# Patient Record
Sex: Male | Born: 1990 | Hispanic: Yes | Marital: Married | Smoking: Never smoker
Health system: Southern US, Community
[De-identification: ages and names within clinical notes are randomized; demographics above are authoritative.]

---

## 2015-04-09 ENCOUNTER — Encounter (HOSPITAL_COMMUNITY): Payer: Self-pay | Admitting: Emergency Medicine

## 2015-04-09 ENCOUNTER — Emergency Department (HOSPITAL_COMMUNITY)
Admission: EM | Admit: 2015-04-09 | Discharge: 2015-04-09 | Disposition: A | Discharge status: Home / Self Care | Payer: BLUE CROSS/BLUE SHIELD | Source: Home / Self Care | Attending: Emergency Medicine | Admitting: Emergency Medicine

## 2015-04-09 DIAGNOSIS — J40 Bronchitis, not specified as acute or chronic: Secondary | ICD-10-CM | POA: Diagnosis not present

## 2015-04-09 MED ORDER — PREDNISONE 50 MG PO TABS: ORAL_TABLET | ORAL | Status: DC

## 2015-04-09 MED ORDER — AZITHROMYCIN 250 MG PO TABS: ORAL_TABLET | ORAL | Status: DC

## 2015-04-09 MED ORDER — HYDROCODONE-HOMATROPINE 5-1.5 MG/5ML PO SYRP: 5.0000 mL | ORAL_SOLUTION | Freq: Four times a day (QID) | ORAL | Status: DC | PRN

## 2015-04-09 NOTE — ED Provider Notes (Signed)
CSN: [id number]     Arrival date & time 04/09/15  1829 History   First MD Initiated Contact with Patient 04/09/15 1940     Chief Complaint  Patient presents with  . Cough  . Headache  . Sore Throat   (Consider location/radiation/quality/duration/timing/severity/associated sxs/prior Treatment) HPI He is a 25 year old man here for evaluation of cough. He states his symptoms started 2 days ago with cough and nasal congestion. Since then he has developed a sore throat and worsening cough. He reports subjective fevers today. He does report some nausea with coughing spells. No vomiting. No shortness of breath.  He has been taking Mucinex and an over-the-counter cold and flu medicine without improvement.  History reviewed. No pertinent past medical history. History reviewed. No pertinent past surgical history. History reviewed. No pertinent family history. Social History  Substance Use Topics  . Smoking status: Never Smoker   . Smokeless tobacco: None  . Alcohol Use: No    Review of Systems As in history of present illness Allergies  Review of patient's allergies indicates no known allergies.  Home Medications   Prior to Admission medications   Medication Sig Start Date End Date Taking? Authorizing Provider  azithromycin (ZITHROMAX Z-PAK) 250 MG tablet Take 2 pills today, then 1 pill daily until gone. 04/09/15   Charm RingsErin J Melvia Matousek, MD  HYDROcodone-homatropine (HYCODAN) 5-1.5 MG/5ML syrup Take 5 mLs by mouth every 6 (six) hours as needed for cough. 04/09/15   Charm RingsErin J Sheyli Horwitz, MD  predniSONE (DELTASONE) 50 MG tablet Take 1 pill daily for 5 days. 04/09/15   Charm RingsErin J Jenine Krisher, MD   Meds Ordered and Administered this Visit  Medications - No data to display  BP 139/86 mmHg  Pulse 96  Temp(Src) 100 F (37.8 C) (Oral)  Resp 16  SpO2 100% No data found.   Physical Exam  Constitutional: He is oriented to person, place, and time. He appears well-developed and well-nourished. No distress.  HENT:   Mouth/Throat: Oropharynx is clear and moist. No oropharyngeal exudate.  Nasal discharge present.  Neck: Neck supple.  Cardiovascular: Normal rate, regular rhythm and normal heart sounds.   No murmur heard. Pulmonary/Chest: Effort normal. No respiratory distress. He has no wheezes. He has no rales.  Diffuse coarseness, particularly with forced expiration  Lymphadenopathy:    He has no cervical adenopathy.  Neurological: He is alert and oriented to person, place, and time.    ED Course  Procedures (including critical care time)  Labs Review Labs Reviewed - No data to display  Imaging Review No results found.    MDM   1. Bronchitis    Treat with azithromycin and prednisone. Hycodan as needed for cough. Recommended OTC allergy medication such as Zyrtec or Allegra. Follow-up as needed.    Charm RingsErin J Deshaun Weisinger, MD 04/09/15 [phone]

## 2015-04-09 NOTE — ED Notes (Signed)
[facility] a [facility] of a headache, cough, sore throat and runny nose x 2 days. The patient stated that he has tried OTC meds with no relief.

## 2015-04-09 NOTE — Discharge Instructions (Signed)
You have bronchitis. Take azithromycin and prednisone as prescribed. Use hycodan as needed for cough. Get an over-the-counter allergy medicine such as Zyrtec or Claritin to help with the congestion. You should see improvement in the next 2-3 days. If you develop fevers, difficulty breathing, or are just not getting better, please come back or go to the emergency room.

## 2016-07-11 DIAGNOSIS — H5213 Myopia, bilateral: Secondary | ICD-10-CM | POA: Diagnosis not present

## 2017-07-25 DIAGNOSIS — H5213 Myopia, bilateral: Secondary | ICD-10-CM | POA: Diagnosis not present

## 2017-09-11 ENCOUNTER — Ambulatory Visit: Payer: BLUE CROSS/BLUE SHIELD | Admitting: Urgent Care

## 2017-09-11 ENCOUNTER — Encounter: Payer: Self-pay | Admitting: Urgent Care

## 2017-09-11 VITALS — BP 125/83 | HR 65 | Temp 98.1°F | Resp 16 | Ht 67.0 in | Wt 188.8 lb

## 2017-09-11 DIAGNOSIS — R519 Headache, unspecified: Secondary | ICD-10-CM

## 2017-09-11 DIAGNOSIS — R42 Dizziness and giddiness: Secondary | ICD-10-CM | POA: Diagnosis not present

## 2017-09-11 DIAGNOSIS — H9202 Otalgia, left ear: Secondary | ICD-10-CM

## 2017-09-11 DIAGNOSIS — R109 Unspecified abdominal pain: Secondary | ICD-10-CM

## 2017-09-11 DIAGNOSIS — R51 Headache: Secondary | ICD-10-CM

## 2017-09-11 DIAGNOSIS — J0110 Acute frontal sinusitis, unspecified: Secondary | ICD-10-CM | POA: Diagnosis not present

## 2017-09-11 DIAGNOSIS — R0981 Nasal congestion: Secondary | ICD-10-CM

## 2017-09-11 DIAGNOSIS — R110 Nausea: Secondary | ICD-10-CM | POA: Diagnosis not present

## 2017-09-11 LAB — POCT URINALYSIS DIP (MANUAL ENTRY)
BILIRUBIN UA: NEGATIVE
Glucose, UA: NEGATIVE mg/dL
Ketones, POC UA: NEGATIVE mg/dL
LEUKOCYTES UA: NEGATIVE
NITRITE UA: NEGATIVE
PH UA: 8 (ref 5.0–8.0)
Spec Grav, UA: 1.02 (ref 1.010–1.025)
Urobilinogen, UA: 2 E.U./dL — AB

## 2017-09-11 LAB — CBC
HEMOGLOBIN: 15.4 g/dL (ref 13.0–17.7)
Hematocrit: 44.1 % (ref 37.5–51.0)
MCH: 32.7 pg (ref 26.6–33.0)
MCHC: 34.9 g/dL (ref 31.5–35.7)
MCV: 94 fL (ref 79–97)
PLATELETS: 144 10*3/uL — AB (ref 150–450)
RBC: 4.71 x10E6/uL (ref 4.14–5.80)
RDW: 12.9 % (ref 12.3–15.4)
WBC: 4.1 10*3/uL (ref 3.4–10.8)

## 2017-09-11 LAB — COMPREHENSIVE METABOLIC PANEL
ALK PHOS: 95 IU/L (ref 39–117)
ALT: 124 IU/L — ABNORMAL HIGH (ref 0–44)
AST: 82 IU/L — AB (ref 0–40)
Albumin/Globulin Ratio: 1.6 (ref 1.2–2.2)
Albumin: 4.6 g/dL (ref 3.5–5.5)
BUN/Creatinine Ratio: 19 (ref 9–20)
BUN: 17 mg/dL (ref 6–20)
Bilirubin Total: 0.4 mg/dL (ref 0.0–1.2)
CO2: 22 mmol/L (ref 20–29)
CREATININE: 0.88 mg/dL (ref 0.76–1.27)
Calcium: 9.2 mg/dL (ref 8.7–10.2)
Chloride: 104 mmol/L (ref 96–106)
GFR calc Af Amer: 138 mL/min/{1.73_m2} (ref >59)
GFR calc non Af Amer: 119 mL/min/{1.73_m2} (ref >59)
GLOBULIN, TOTAL: 2.8 g/dL (ref 1.5–4.5)
GLUCOSE: 104 mg/dL — AB (ref 65–99)
Potassium: 4.4 mmol/L (ref 3.5–5.2)
SODIUM: 140 mmol/L (ref 134–144)
Total Protein: 7.4 g/dL (ref 6.0–8.5)

## 2017-09-11 MED ORDER — AMOXICILLIN 875 MG PO TABS: 875.0000 mg | ORAL_TABLET | Freq: Two times a day (BID) | ORAL | 0 refills | Status: DC

## 2017-09-11 MED ORDER — ONDANSETRON 8 MG PO TBDP: 8.0000 mg | ORAL_TABLET | Freq: Three times a day (TID) | ORAL | 0 refills | Status: DC | PRN

## 2017-09-11 NOTE — Patient Instructions (Addendum)
You may take 500mg  Tylenol with ibuprofen 600mg  every 6 hours for pain and inflammation associated with your headache. We will try to use amoxicillin to address a sinusitis. However, if you continue to have severe headaches, develop confusion, fever, or have worsening dizziness, nausea, belly pain then return to our clinic for a recheck. Hydrate well with at least 2 liters of water daily.      Sinusitis, Adult Sinusitis is soreness and inflammation of your sinuses. Sinuses are hollow spaces in the bones around your face. Your sinuses are located:  Around your eyes.  In the middle of your forehead.  Behind your nose.  In your cheekbones.  Your sinuses and nasal passages are lined with a stringy fluid (mucus). Mucus normally drains out of your sinuses. When your nasal tissues become inflamed or swollen, the mucus can become trapped or blocked so air cannot flow through your sinuses. This allows bacteria, viruses, and funguses to grow, which leads to infection. Sinusitis can develop quickly and last for 7?10 days (acute) or for more than 12 weeks (chronic). Sinusitis often develops after a cold. What are the causes? This condition is caused by anything that creates swelling in the sinuses or stops mucus from draining, including:  Allergies.  Asthma.  Bacterial or viral infection.  Abnormally shaped bones between the nasal passages.  Nasal growths that contain mucus (nasal polyps).  Narrow sinus openings.  Pollutants, such as chemicals or irritants in the air.  A foreign object stuck in the nose.  A fungal infection. This is rare.  What increases the risk? The following factors may make you more likely to develop this condition:  Having allergies or asthma.  Having had a recent cold or respiratory tract infection.  Having structural deformities or blockages in your nose or sinuses.  Having a weak immune system.  Doing a lot of swimming or diving.  Overusing nasal  sprays.  Smoking.  What are the signs or symptoms? The main symptoms of this condition are pain and a feeling of pressure around the affected sinuses. Other symptoms include:  Upper toothache.  Earache.  Headache.  Bad breath.  Decreased sense of smell and taste.  A cough that may get worse at night.  Fatigue.  Fever.  Thick drainage from your nose. The drainage is often green and it may contain pus (purulent).  Stuffy nose or congestion.  Postnasal drip. This is when extra mucus collects in the throat or back of the nose.  Swelling and warmth over the affected sinuses.  Sore throat.  Sensitivity to light.  How is this diagnosed? This condition is diagnosed based on symptoms, a medical history, and a physical exam. To find out if your condition is acute or chronic, your health care provider may:  Look in your nose for signs of nasal polyps.  Tap over the affected sinus to check for signs of infection.  View the inside of your sinuses using an imaging device that has a light attached (endoscope).  If your health care provider suspects that you have chronic sinusitis, you may also:  Be tested for allergies.  Have a sample of mucus taken from your nose (nasal culture) and checked for bacteria.  Have a mucus sample examined to see if your sinusitis is related to an allergy.  If your sinusitis does not respond to treatment and it lasts longer than 8 weeks, you may have an MRI or CT scan to check your sinuses. These scans also help to determine  how severe your infection is. In rare cases, a bone biopsy may be done to rule out more serious types of fungal sinus disease. How is this treated? Treatment for sinusitis depends on the cause and whether your condition is chronic or acute. If a virus is causing your sinusitis, your symptoms will go away on their own within 10 days. You may be given medicines to relieve your symptoms, including:  Topical nasal decongestants.  They shrink swollen nasal passages and let mucus drain from your sinuses.  Antihistamines. These drugs block inflammation that is triggered by allergies. This can help to ease swelling in your nose and sinuses.  Topical nasal corticosteroids. These are nasal sprays that ease inflammation and swelling in your nose and sinuses.  Nasal saline washes. These rinses can help to get rid of thick mucus in your nose.  If your condition is caused by bacteria, you will be given an antibiotic medicine. If your condition is caused by a fungus, you will be given an antifungal medicine. Surgery may be needed to correct underlying conditions, such as narrow nasal passages. Surgery may also be needed to remove polyps. Follow these instructions at home: Medicines  Take, use, or apply over-the-counter and prescription medicines only as told by your health care provider. These may include nasal sprays.  If you were prescribed an antibiotic medicine, take it as told by your health care provider. Do not stop taking the antibiotic even if you start to feel better. Hydrate and Humidify  Drink enough water to keep your urine clear or pale yellow. Staying hydrated will help to thin your mucus.  Use a cool mist humidifier to keep the humidity level in your home above 50%.  Inhale steam for 10-15 minutes, 3-4 times a day or as told by your health care provider. You can do this in the bathroom while a hot shower is running.  Limit your exposure to cool or dry air. Rest  Rest as much as possible.  Sleep with your head raised (elevated).  Make sure to get enough sleep each night. General instructions  Apply a warm, moist washcloth to your face 3-4 times a day or as told by your health care provider. This will help with discomfort.  Wash your hands often with soap and water to reduce your exposure to viruses and other germs. If soap and water are not available, use hand sanitizer.  Do not smoke. Avoid being  around people who are smoking (secondhand smoke).  Keep all follow-up visits as told by your health care provider. This is important. Contact a health care provider if:  You have a fever.  Your symptoms get worse.  Your symptoms do not improve within 10 days. Get help right away if:  You have a severe headache.  You have persistent vomiting.  You have pain or swelling around your face or eyes.  You have vision problems.  You develop confusion.  Your neck is stiff.  You have trouble breathing. This information is not intended to replace advice given to you by your health care provider. Make sure you discuss any questions you have with your health care provider. Document Released: 01/17/2005 Document Revised: 09/13/2015 Document Reviewed: 11/12/2014 Elsevier Interactive Patient Education  2018 [employer]     IF you received an x-ray today, you will receive an invoice from [facility]. Please contact [facility] at [phone] with questions or concerns regarding your invoice.   IF you received labwork today, you will receive an  invoice from [facility]. Please contact [facility] at [phone] with questions or concerns regarding your invoice.   Our billing staff will not be able to assist you with questions regarding bills from these companies.  You will be contacted with the lab results as soon as they are available. The fastest way to get your results is to activate your My Chart account. Instructions are located on the last page of this paperwork. If you have not heard from [place] regarding the results in 2 weeks, please contact this office.

## 2017-09-11 NOTE — Progress Notes (Signed)
MRN: [id number] DOB: November 06, 1990  Subjective:   Arthur Morris is a 27 y.o. male presenting for 3 day history of intermittent headache. Symptoms started with mild headache Saturday. Pain was throbbing in nature, pulsating sensation behind both eyes. Headache returned Sunday and was severe, had difficulty moving his neck, eye movements were also painful. Has used Excedrin, Goody Powders, APAP. Has started having nausea without vomiting, dizziness, also had epigastric discomfort yesterday. Had mild ear pain over left side yesterday. Denies fever, confusion, sinus pain, sore throat, cough, chest pain. Denies smoking cigarettes, has occasional alcohol drink. Had 1 beer Friday night. Has ~6 bottles of water daily. Had 1 bottle of water this morning. 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 Has had ~5-6 hours of sleep per night in the past 2 weeks, usually gets ~7-8 hours. Denies history of migraines.   Arthur Morris is not currently taking any medications and has no known food or drug allergies.  Denies past medical and surgical history. Family history is positive for diabetes in his father.   Patient had Hepatitis B, polio, Hib, MMR, tdap, dtap series but not meningococcal per [facility].   Objective:   Vitals: BP 125/83   Pulse 65   Temp 98.1 F (36.7 C) (Oral)   Resp 16   Ht _0  (1.702 m)   Wt 188 lb 12.8 oz (85.6 kg)   SpO2 100%   BMI 29.57 kg/m   Physical Exam  Constitutional: He is oriented to person, place, and time. He appears well-developed and well-nourished.  HENT:  Mouth/Throat: Oropharynx is clear and moist.  Frontal sinus pain. Mucosal edema. TMs opaque bilaterally with signs of sclerosis.   Eyes: Pupils are equal, round, and reactive to light. EOM are normal. Right eye exhibits no discharge. Left eye exhibits no discharge. No scleral icterus.  Neck: Normal range of motion. Neck supple.  Cardiovascular: Normal rate, regular rhythm, normal  heart sounds and intact distal pulses. Exam reveals no gallop and no friction rub.  No murmur heard. Pulmonary/Chest: Effort normal and breath sounds normal. No stridor. No respiratory distress. He has no wheezes. He has no rales.  Abdominal: Soft. Bowel sounds are normal. He exhibits no distension and no mass. There is no tenderness. There is no rebound and no guarding.  Musculoskeletal: Normal range of motion. He exhibits no edema or tenderness.  Strength 5/5. Heel to shin normal bilaterally. Negative Kernig and Brudzinski.  Lymphadenopathy:    He has no cervical adenopathy.  Neurological: He is alert and oriented to person, place, and time. He displays normal reflexes. No cranial nerve deficit. Coordination normal.  Skin: Skin is warm and dry.  Psychiatric: He has a normal mood and affect.    Results for orders placed or performed in visit on 09/11/17 (from the past 24 hour(s))  POCT urinalysis dipstick     Status: Abnormal   Collection Time: 09/11/17  9:46 AM  Result Value Ref Range   Color, UA yellow yellow   Clarity, UA clear clear   Glucose, UA negative negative mg/dL   Bilirubin, UA negative negative   Ketones, POC UA negative negative mg/dL   Spec Grav, UA 1.020 1.010 - 1.025   Blood, UA trace-intact (A) negative   pH, UA 8.0 5.0 - 8.0   Protein Ur, POC trace (A) negative mg/dL   Urobilinogen, UA 2.0 (A) 0.2 or 1.0 E.U./dL   Nitrite, UA Negative Negative   Leukocytes, UA Negative Negative   Assessment  and Plan :   Acute non-recurrent frontal sinusitis  Dizzy - Plan: Comprehensive metabolic panel, CBC, POCT urinalysis dipstick  Bad headache  Nausea without vomiting  Belly pain  Nasal congestion  Left ear pain  Physical exam findings reassuring and consistent with frontal sinusitis.  Counseled patient on differential which includes meningitis versus onset of undifferentiated migraines.  Patient admits to history of seasonal allergies with pollen but does not take  allergy medications consistently.  We will try to address his symptoms that with amoxicillin for a sinus infection.  Supportive care recommended otherwise including Zofran. Counseled patient on potential for adverse effects with medications prescribed today, patient verbalized understanding.  Labs pending.  ER and return to clinic precautions reviewed.  Jaynee Eagles, PA-C [facility] [phone] 09/11/2017  9:44 AM

## 2017-09-13 ENCOUNTER — Encounter: Payer: Self-pay | Admitting: Urgent Care

## 2017-09-13 ENCOUNTER — Ambulatory Visit (INDEPENDENT_AMBULATORY_CARE_PROVIDER_SITE_OTHER): Payer: BLUE CROSS/BLUE SHIELD | Admitting: Urgent Care

## 2017-09-13 VITALS — BP 118/72 | HR 61 | Temp 98.2°F | Resp 16 | Ht 67.0 in | Wt 188.0 lb

## 2017-09-13 DIAGNOSIS — R945 Abnormal results of liver function studies: Secondary | ICD-10-CM

## 2017-09-13 DIAGNOSIS — R7989 Other specified abnormal findings of blood chemistry: Secondary | ICD-10-CM

## 2017-09-13 DIAGNOSIS — J0110 Acute frontal sinusitis, unspecified: Secondary | ICD-10-CM | POA: Diagnosis not present

## 2017-09-13 NOTE — Patient Instructions (Addendum)
Sinusitis, Adult Sinusitis is soreness and inflammation of your sinuses. Sinuses are hollow spaces in the bones around your face. Your sinuses are located:  Around your eyes.  In the middle of your forehead.  Behind your nose.  In your cheekbones.  Your sinuses and nasal passages are lined with a stringy fluid (mucus). Mucus normally drains out of your sinuses. When your nasal tissues become inflamed or swollen, the mucus can become trapped or blocked so air cannot flow through your sinuses. This allows bacteria, viruses, and funguses to grow, which leads to infection. Sinusitis can develop quickly and last for 7?10 days (acute) or for more than 12 weeks (chronic). Sinusitis often develops after a cold. What are the causes? This condition is caused by anything that creates swelling in the sinuses or stops mucus from draining, including:  Allergies.  Asthma.  Bacterial or viral infection.  Abnormally shaped bones between the nasal passages.  Nasal growths that contain mucus (nasal polyps).  Narrow sinus openings.  Pollutants, such as chemicals or irritants in the air.  A foreign object stuck in the nose.  A fungal infection. This is rare.  What increases the risk? The following factors may make you more likely to develop this condition:  Having allergies or asthma.  Having had a recent cold or respiratory tract infection.  Having structural deformities or blockages in your nose or sinuses.  Having a weak immune system.  Doing a lot of swimming or diving.  Overusing nasal sprays.  Smoking.  What are the signs or symptoms? The main symptoms of this condition are pain and a feeling of pressure around the affected sinuses. Other symptoms include:  Upper toothache.  Earache.  Headache.  Bad breath.  Decreased sense of smell and taste.  A cough that may get worse at night.  Fatigue.  Fever.  Thick drainage from your nose. The drainage is often green and  it may contain pus (purulent).  Stuffy nose or congestion.  Postnasal drip. This is when extra mucus collects in the throat or back of the nose.  Swelling and warmth over the affected sinuses.  Sore throat.  Sensitivity to light.  How is this diagnosed? This condition is diagnosed based on symptoms, a medical history, and a physical exam. To find out if your condition is acute or chronic, your health care provider may:  Look in your nose for signs of nasal polyps.  Tap over the affected sinus to check for signs of infection.  View the inside of your sinuses using an imaging device that has a light attached (endoscope).  If your health care provider suspects that you have chronic sinusitis, you may also:  Be tested for allergies.  Have a sample of mucus taken from your nose (nasal culture) and checked for bacteria.  Have a mucus sample examined to see if your sinusitis is related to an allergy.  If your sinusitis does not respond to treatment and it lasts longer than 8 weeks, you may have an MRI or CT scan to check your sinuses. These scans also help to determine how severe your infection is. In rare cases, a bone biopsy may be done to rule out more serious types of fungal sinus disease. How is this treated? Treatment for sinusitis depends on the cause and whether your condition is chronic or acute. If a virus is causing your sinusitis, your symptoms will go away on their own within 10 days. You may be given medicines to relieve your symptoms,   including:  Topical nasal decongestants. They shrink swollen nasal passages and let mucus drain from your sinuses.  Antihistamines. These drugs block inflammation that is triggered by allergies. This can help to ease swelling in your nose and sinuses.  Topical nasal corticosteroids. These are nasal sprays that ease inflammation and swelling in your nose and sinuses.  Nasal saline washes. These rinses can help to get rid of thick mucus in  your nose.  If your condition is caused by bacteria, you will be given an antibiotic medicine. If your condition is caused by a fungus, you will be given an antifungal medicine. Surgery may be needed to correct underlying conditions, such as narrow nasal passages. Surgery may also be needed to remove polyps. Follow these instructions at home: Medicines  Take, use, or apply over-the-counter and prescription medicines only as told by your health care provider. These may include nasal sprays.  If you were prescribed an antibiotic medicine, take it as told by your health care provider. Do not stop taking the antibiotic even if you start to feel better. Hydrate and Humidify  Drink enough water to keep your urine clear or pale yellow. Staying hydrated will help to thin your mucus.  Use a cool mist humidifier to keep the humidity level in your home above 50%.  Inhale steam for 10-15 minutes, 3-4 times a day or as told by your health care provider. You can do this in the bathroom while a hot shower is running.  Limit your exposure to cool or dry air. Rest  Rest as much as possible.  Sleep with your head raised (elevated).  Make sure to get enough sleep each night. General instructions  Apply a warm, moist washcloth to your face 3-4 times a day or as told by your health care provider. This will help with discomfort.  Wash your hands often with soap and water to reduce your exposure to viruses and other germs. If soap and water are not available, use hand sanitizer.  Do not smoke. Avoid being around people who are smoking (secondhand smoke).  Keep all follow-up visits as told by your health care provider. This is important. Contact a health care provider if:  You have a fever.  Your symptoms get worse.  Your symptoms do not improve within 10 days. Get help right away if:  You have a severe headache.  You have persistent vomiting.  You have pain or swelling around your face or  eyes.  You have vision problems.  You develop confusion.  Your neck is stiff.  You have trouble breathing. This information is not intended to replace advice given to you by your health care provider. Make sure you discuss any questions you have with your health care provider. Document Released: 01/17/2005 Document Revised: 09/13/2015 Document Reviewed: 11/12/2014 Elsevier Interactive Patient Education  [employer]2018 [employer]    I will contact you with your lab results within the next 2 weeks.  If you have not heard from us then please contact us. The fastest way to get your results is to register for My Chart.   Liver Function Tests Liver function tests are blood tests to see how well your liver is working. The proteins and enzymes measured in the test can alert your health care provider to inflammation, damage, or disease in your liver. It is common to have liver function tests:  During annual physical exams.  When you are taking certain medicines.  If you have liver disease.  If you drink  a lot of alcohol.  When you are not feeling well.  When you have other conditions that may affect the liver.  Substances measured may include:  Alanine transaminase (ALT). This is an enzyme in the liver.  Aspartate transaminase (AST). This is an enzyme in the liver, heart, and muscles.  Alkaline phosphatase (ALP). This is a protein in the liver, bile ducts, and bone. It is also in other body tissues.  Total bilirubin. This is a yellow pigment in bile.  Albumin. This is a protein in the liver.  Prothrombin time and international normalized ratio (PT and INR). PT measures the time that it takes for your blood to clot. INR is a calculation of blood clotting time based upon your PT result. It is also calculated based on normal ranges defined by the laboratory that processed your lab test.  Total protein. This measures two proteins, albumin and globulin, found in the blood.  How do I  prepare for this test? How you prepare will depend on which tests are being done and the reason why these tests are being done. You may need to:  Avoid eating for 4-6 hours before the test or as directed by your health care provider.  Stop taking certain medicines prior to your blood test as directed by your health care provider.  What do the results mean? It is your responsibility to obtain your test results. Ask the lab or department performing the test when and how you will get your results. Contact your health care provider to discuss any questions you have about your results. RANGE OF NORMAL VALUES Ranges for normal values may vary among different labs and hospitals. You should always check with your health care provider after having lab work or other tests done to discuss the meaning of your test results and whether your values are considered within normal limits. The following are normal ranges for substances measured in liver function tests: ALT  Infant: may be twice as high as adult values.  Child or adult: 4-36 international units/L at 37C or 4-36 units/L (SI units).  Elderly: may be slightly higher than adult values. AST  Newborn 370-765 days old: 35-140 units/L.  Child under 25 years old: 15-60 units/L.  553-34795 years old: 15-50 units/L.  36-35433 years old: 10-50 units/L.  1512-25 years old: 10-40 units/L.  Adult: 0-35 units/L or 0-0.58 microkatal/L (SI units).  Elderly: slightly higher than adults. ALP  Child under 25 years old: 85-235 units/L.  652-25 years old: 65-210 units/L.  169-25 years old: 60-300 units/L.  3216-25 years old: 30-200 units/L.  Adult: 30-120 units/L or 0.5-2.0 microkatal/L (SI units).  Elderly: slightly higher than adult. Total bilirubin  Newborn: 1.0-12.0 mg/dL or 40.9-81117.1-205 micromoles/L (SI units).  Adult, elderly, or child: 0.3-1.0 mg/dL or 9.1-475.1-17 micromoles/L. Albumin  Premature infant: 3.0-4.2 g/dL.  Newborn: 3.5-5.4 g/dL.  Infant: 4.4-5.4  g/dL.  Child: 4.0-5.9 g/dL.  Adult or elderly: 3.5-5.0 g/dL or 82-9535-50 g/L (SI units). PT  11.0-12.5 seconds; 85%-100%. INR  0.8-1.1. Total protein  Premature infant: 4.2-7.6 g/dL.  Newborn: 4.6-7.4 g/dL.  Infant: 6.0-6.7 g/dL.  Child: 6.2-8.0 g/dL.  Adult or elderly: 6.4-8.3 g/dL or 62-1364-83 g/L (SI units). MEANING OF RESULTS OUTSIDE NORMAL VALUE RANGES Sometimes test results can be abnormal due to other factors, such as medicines, exercise, or pregnancy. Follow up with your health care provider if you have any questions about test results outside the normal value ranges. ALT  Levels above the normal range, along with other test  results, may indicate liver disease. AST  Levels above the normal range, along with other test results, may indicate liver disease. Sometimes levels also increase after burns, surgery, heart attack, muscle damage, or seizure. ALP  Levels above the normal range, along with other test results, may indicate biliary obstruction, diseases of the liver, bone disease, thyroid disease, tumors, fractures, leukemia or lymphoma, or several other conditions. People with blood type O or B may show higher levels after a fatty meal.  Levels below the normal range, along with other test results, may indicate bone and teeth conditions, malnutrition, protein deficiency, or Wilson disease. Total bilirubin  Levels above the normal range, along with other test results, may indicate problems with the liver, gallbladder, or bile ducts. Albumin  Levels above the normal range, along with other test results, may indicate dehydration. They may also be caused by a diet that is high in protein. Sometimes, the band placed around the upper arm during the process of drawing blood can cause the level of this protein in your blood to rise and give you a result above the normal range.  Levels below the normal range, along with other tests results, may indicate kidney disease, liver  disease, or malabsorption of nutrients. PT and INR  Levels above the normal range mean your blood is clotting slower than normal. This may be due to blood disorders, liver disorders, or low levels of vitamin K. Total protein  Levels above the normal range, along with other test results, may be due to infection or other diseases.  Levels below the normal range, along with other test results, may be due to an immune system disorder, bleeding, burns, kidney disorder, liver disease, trouble absorbing or getting enough nutrients, or other conditions that affect the intestines. Talk with your health care provider to discuss your results, treatment options, and if necessary, the need for more tests. Talk with your health care provider if you have any questions about your results. This information is not intended to replace advice given to you by your health care provider. Make sure you discuss any questions you have with your health care provider. Document Released: 02/20/2004 Document Revised: 09/23/2015 Document Reviewed: 05/23/2013 Elsevier Interactive Patient Education  2018 [employer].    IF you received an x-ray today, you will receive an invoice from [facility]. Please contact [facility] at [phone] with questions or concerns regarding your invoice.   IF you received labwork today, you will receive an invoice from [place]. Please contact [facility] at [phone] with questions or concerns regarding your invoice.   Our billing staff will not be able to assist you with questions regarding bills from these companies.  You will be contacted with the lab results as soon as they are available. The fastest way to get your results is to activate your My Chart account. Instructions are located on the last page of this paperwork. If you have not heard from [place] regarding the results in 2 weeks, please contact this office.

## 2017-09-13 NOTE — Progress Notes (Signed)
    MRN: [id number] DOB: [birth year]  Subjective:   Arthur Morris is a 27 y.o. male presenting for follow-up on headache, sinusitis, dizziness, malaise.  Patient was last seen on 09/11/2017, physical exam findings were reassuring against meningitis.  We decided to treat patient for a clinical diagnosis of frontal sinusitis.  Start amoxicillin.  Today, patient reports he is doing much better.  Reports that his headache and dizziness is improved, denies fever, neck stiffness, nausea, vomiting, belly pain.  Denies history of liver disease, heavy alcohol use.  Arthur Morris has a current medication list which includes the following prescription(s): amoxicillin and ondansetron. Also has No Known Allergies.  Arthur Morris denies past medical and surgical history.   Objective:   Vitals: BP 118/72   Pulse 61   Temp 98.2 F (36.8 C)   Resp 16   Ht 5\' 7"  (1.702 m)   Wt 188 lb (85.3 kg)   SpO2 98%   BMI 29.44 kg/m   Physical Exam  Constitutional: He is oriented to person, place, and time. He appears well-developed and well-nourished.  Cardiovascular: Normal rate.  Pulmonary/Chest: Effort normal.  Neurological: He is alert and oriented to person, place, and time.   Assessment and Plan :   Acute non-recurrent frontal sinusitis  Elevated LFTs  Feeling improved with amoxicillin.  Counseled that we will continue to manage this as sinusitis.  Return to clinic precautions reviewed.  Discussed differential for elevated liver enzymes.  Offered patient further work-up but for now he would like to hold off on this.  We will plan to come back in 3 months for recheck on his liver enzymes.  In the meantime he will avoid alcohol use, Tylenol.  Wallis BambergMario Chang Tiggs, PA-C [facility] [phone] 09/13/2017  10:17 AM

## 2017-10-05 ENCOUNTER — Telehealth: Payer: Self-pay | Admitting: Urgent Care

## 2017-10-05 NOTE — Telephone Encounter (Signed)
Called pt to try and reschedule his appt that he has scheduled with Wallis Bamberg on 12/16/17. Patient stated that he will call back this afternoon and make an appt with someone. When he calls in, he will need a Saturday appt around 12/16/17. The appt will be for :  Return in about 3 months (around 12/14/2017) for liver enzymes.  Thank you!

## 2017-12-16 ENCOUNTER — Ambulatory Visit: Payer: BLUE CROSS/BLUE SHIELD | Admitting: Urgent Care

## 2018-11-10 DIAGNOSIS — H5213 Myopia, bilateral: Secondary | ICD-10-CM | POA: Diagnosis not present

## 2019-01-15 DIAGNOSIS — R430 Anosmia: Secondary | ICD-10-CM | POA: Diagnosis not present

## 2019-01-15 DIAGNOSIS — Z20828 Contact with and (suspected) exposure to other viral communicable diseases: Secondary | ICD-10-CM | POA: Diagnosis not present

## 2019-01-15 DIAGNOSIS — J029 Acute pharyngitis, unspecified: Secondary | ICD-10-CM | POA: Diagnosis not present

## 2019-01-15 DIAGNOSIS — R509 Fever, unspecified: Secondary | ICD-10-CM | POA: Diagnosis not present

## 2019-02-20 ENCOUNTER — Other Ambulatory Visit: Payer: Self-pay

## 2019-02-20 ENCOUNTER — Ambulatory Visit (HOSPITAL_COMMUNITY)
Admission: EM | Admit: 2019-02-20 | Discharge: 2019-02-20 | Disposition: A | Discharge status: Home / Self Care | Payer: BC Managed Care – PPO

## 2019-02-20 ENCOUNTER — Encounter (HOSPITAL_COMMUNITY): Payer: Self-pay | Admitting: Emergency Medicine

## 2019-02-20 DIAGNOSIS — K2900 Acute gastritis without bleeding: Secondary | ICD-10-CM

## 2019-02-20 DIAGNOSIS — R1111 Vomiting without nausea: Secondary | ICD-10-CM

## 2019-02-20 DIAGNOSIS — R1013 Epigastric pain: Secondary | ICD-10-CM

## 2019-02-20 MED ORDER — OMEPRAZOLE 20 MG PO CPDR: 20.0000 mg | DELAYED_RELEASE_CAPSULE | Freq: Every day | ORAL | 0 refills | Status: AC

## 2019-02-20 MED ORDER — FAMOTIDINE 20 MG PO TABS: 20.0000 mg | ORAL_TABLET | Freq: Two times a day (BID) | ORAL | 0 refills | Status: AC

## 2019-02-20 NOTE — Discharge Instructions (Signed)
After ~2 weeks, start cutting back on famotidine (Pepcid). Go to 1 tablet per day and then at 4 weeks, you can stop it all together and use it only as needed thereafter.   Make sure you establish care through [facility] to see about getting tested for H. Pylori infection.

## 2019-02-20 NOTE — ED Triage Notes (Signed)
Abdominal pain started Sunday night.  Sunday morning vomited x 1.  Denies diarrhea.  Patient points to center epigastric pain.  .  Lying on left side to sleep is the only way to sleep.  Denies urinary symptoms

## 2019-02-20 NOTE — ED Provider Notes (Signed)
[facility]   MRN: [id number] DOB: [birth year]  Subjective:   Arthur Morris is a 29 y.o. male presenting for 3-day history of persistent mid/epigastric abdominal pain.  Patient had one episode of vomiting on Sunday morning.  He admits having drank heavily twice last week.  He also states that he has a very sensitive stomach.  Denies history of H. pylori, ulcers.  No current facility-administered medications for this encounter.  Current Outpatient Medications:  .  NON FORMULARY, Reports taking medicine provided by mother, but no relief, Disp: , Rfl:    No Known Allergies  History reviewed. No pertinent past medical history.   History reviewed. No pertinent surgical history.  Family History  Problem Relation Age of Onset  . Diabetes Father     Social History   Tobacco Use  . Smoking status: Never Smoker  . Smokeless tobacco: Never Used  Substance Use Topics  . Alcohol use: Yes  . Drug use: Never    Review of Systems  Constitutional: Negative for fever and malaise/fatigue.  HENT: Negative for congestion, ear pain, sinus pain and sore throat.   Eyes: Negative for discharge and redness.  Respiratory: Negative for cough, hemoptysis, shortness of breath and wheezing.   Cardiovascular: Negative for chest pain.  Gastrointestinal: Positive for abdominal pain and vomiting. Negative for blood in stool, constipation, diarrhea and nausea.  Genitourinary: Negative for dysuria, flank pain and hematuria.  Musculoskeletal: Negative for myalgias.  Skin: Negative for rash.  Neurological: Negative for dizziness, weakness and headaches.  Psychiatric/Behavioral: Negative for depression and substance abuse.     Objective:   Vitals: BP 139/87 (BP Location: Left Arm)   Pulse 88   Temp 99.1 F (37.3 C) (Oral)   Resp 18   SpO2 99%   Physical Exam Constitutional:      General: He is not in acute distress.    Appearance: Normal appearance. He is well-developed. He is  not ill-appearing, toxic-appearing or diaphoretic.  HENT:     Head: Normocephalic and atraumatic.     Right Ear: External ear normal.     Left Ear: External ear normal.     Nose: Nose normal.     Mouth/Throat:     Mouth: Mucous membranes are moist.     Pharynx: Oropharynx is clear.  Eyes:     General: No scleral icterus.    Extraocular Movements: Extraocular movements intact.     Pupils: Pupils are equal, round, and reactive to light.  Cardiovascular:     Rate and Rhythm: Normal rate and regular rhythm.     Heart sounds: Normal heart sounds. No murmur. No friction rub. No gallop.   Pulmonary:     Effort: Pulmonary effort is normal. No respiratory distress.     Breath sounds: Normal breath sounds. No stridor. No wheezing, rhonchi or rales.  Abdominal:     General: Bowel sounds are normal. There is no distension.     Palpations: Abdomen is soft. There is no mass.     Tenderness: There is generalized abdominal tenderness and tenderness in the epigastric area. There is no right CVA tenderness, left CVA tenderness, guarding or rebound. Negative signs include Rovsing's sign and McBurney's sign.  Skin:    General: Skin is warm and dry.  Neurological:     Mental Status: He is alert and oriented to person, place, and time.  Psychiatric:        Mood and Affect: Mood normal.        Behavior:  Behavior normal.        Thought Content: Thought content normal.      Assessment and Plan :   1. Acute gastritis without hemorrhage, unspecified gastritis type   2. Epigastric pain   3. Vomiting without nausea, intractability of vomiting not specified, unspecified vomiting type     Suspect gastritis due to heavy alcohol use last week.  Counseled on possibility of H. pylori infection as well. Start omeprazole, Pepcid.  Establish care with [facility] for follow-up. Counseled patient on potential for adverse effects with medications prescribed/recommended today, ER and return-to-clinic  precautions discussed, patient verbalized understanding.    Jaynee Eagles, PA-C 02/20/19 1225

## 2020-06-22 ENCOUNTER — Encounter (HOSPITAL_COMMUNITY): Payer: Self-pay | Admitting: Emergency Medicine

## 2020-06-22 ENCOUNTER — Other Ambulatory Visit: Payer: Self-pay

## 2020-06-22 ENCOUNTER — Ambulatory Visit (HOSPITAL_COMMUNITY)
Admission: EM | Admit: 2020-06-22 | Discharge: 2020-06-22 | Disposition: A | Discharge status: Home / Self Care | Payer: Self-pay | Attending: Family Medicine | Admitting: Family Medicine

## 2020-06-22 ENCOUNTER — Ambulatory Visit (INDEPENDENT_AMBULATORY_CARE_PROVIDER_SITE_OTHER): Payer: Self-pay

## 2020-06-22 DIAGNOSIS — M25571 Pain in right ankle and joints of right foot: Secondary | ICD-10-CM

## 2020-06-22 DIAGNOSIS — M79661 Pain in right lower leg: Secondary | ICD-10-CM

## 2020-06-22 DIAGNOSIS — Y9366 Activity, soccer: Secondary | ICD-10-CM

## 2020-06-22 DIAGNOSIS — S8011XA Contusion of right lower leg, initial encounter: Secondary | ICD-10-CM

## 2020-06-22 DIAGNOSIS — W501XXA Accidental kick by another person, initial encounter: Secondary | ICD-10-CM

## 2020-06-22 NOTE — ED Provider Notes (Signed)
[facility]   [id number] 06/22/20 Arrival Time: 0813  ASSESSMENT & PLAN:  1. Pain in right lower leg   2. Acute right ankle pain   3. Hematoma of right lower leg     I have personally viewed the imaging studies ordered this visit. No fractures appreciated on ankle and tib/fib films.  WBAT. Ice.   Discharge Instructions     If not allergic, you may use over the counter ibuprofen or acetaminophen as needed as well as using over the counter diclofenac gel.    Recommend:    Follow-up Information    [facility].   Why: If worsening or failing to improve as anticipated. Contact information: [address] [place] [address] [phone]              Reviewed expectations re: course of current medical issues. Questions answered. Outlined signs and symptoms indicating need for more acute intervention. Patient verbalized understanding. After Visit Summary given.  SUBJECTIVE: History from: patient. Lorimer Tiberio is a 30 y.o. male who reports R lower leg pain; s/p soccer injury one week ago; kicked in leg; swelling and bruising since. Able to bear weight but with discomfort. No extremity sensation changes or weakness. Ice and OTC analgesics without much relief. No specific worsening; "just still hurts".   History reviewed. No pertinent surgical history.    OBJECTIVE:  Vitals:   06/22/20 0857  BP: 120/76  Pulse: 64  Resp: 18  Temp: 98.5 F (36.9 C)  TempSrc: Oral  SpO2: 99%    General appearance: alert; no distress HEENT: [place]; AT Neck: supple with FROM Resp: unlabored respirations Extremities: . RLE: warm with well perfused appearance; poorly localized moderate tenderness over inner right lower leg and around medial ankle; without gross deformities; swelling: moderate; bruising: moderatewith apparent hematoma above ankle; ankle and knee ROM: normal CV: brisk extremity capillary refill of  RLE; 2+ DP pulse of RLE. 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 EXAM: RIGHT TIBIA AND FIBULA - 2 VIEW COMPARISON:  None. FINDINGS: There is no evidence of fracture or other focal bone lesions. Soft tissues are unremarkable. IMPRESSION: Negative. Electronically Signed   By: Feliberto Harts MD   On: 06/22/2020 09:42   DG Ankle Complete Right  Result Date: 06/22/2020 CLINICAL DATA:  Trauma.  Soccer player.  Kicked in lower leg. EXAM: RIGHT ANKLE - COMPLETE 3+ VIEW COMPARISON:  None. FINDINGS: No evidence of acute fracture. Ankle mortises congruent. There is no evidence of arthropathy or other focal bone abnormality. Soft tissues are unremarkable. IMPRESSION: No evidence of acute fracture or dislocation. Electronically Signed   By: Feliberto Harts MD   On: 06/22/2020 09:41     No Known Allergies  History reviewed. No pertinent past medical history. Social History   Socioeconomic History  . Marital status: Married    Spouse name: Not on file  . Number of children: Not on file  . Years of education: Not on file  . Highest education level: Not on file  Occupational History  . Not on file  Tobacco Use  . Smoking status: Never Smoker  . Smokeless tobacco: Never Used  Vaping Use  . Vaping Use: Never used  Substance and Sexual Activity  . Alcohol use:  Yes  . Drug use: Never  . Sexual activity: Not on file  Other Topics Concern  . Not on file  Social History Narrative  . Not on file   Social Determinants of Health   Financial Resource Strain: Not on file  Food Insecurity: Not on file  Transportation Needs: Not on file  Physical Activity: Not on file  Stress: Not on file  Social Connections: Not on file   Family History  Problem Relation Age of  Onset  . Diabetes Father    History reviewed. No pertinent surgical history.    Mardella Layman, MD 06/22/20 1049

## 2020-06-22 NOTE — Discharge Instructions (Signed)
If not allergic, you may use over the counter ibuprofen or acetaminophen as needed as well as using over the counter diclofenac gel.
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
## 2023-12-04 ENCOUNTER — Ambulatory Visit (INDEPENDENT_AMBULATORY_CARE_PROVIDER_SITE_OTHER): Payer: Self-pay

## 2023-12-04 ENCOUNTER — Ambulatory Visit (HOSPITAL_COMMUNITY)
Admission: EM | Admit: 2023-12-04 | Discharge: 2023-12-04 | Disposition: A | Discharge status: Home / Self Care | Payer: Self-pay

## 2023-12-04 ENCOUNTER — Encounter (HOSPITAL_COMMUNITY): Payer: Self-pay | Admitting: Emergency Medicine

## 2023-12-04 DIAGNOSIS — S62610A Displaced fracture of proximal phalanx of right index finger, initial encounter for closed fracture: Secondary | ICD-10-CM

## 2023-12-04 MED ORDER — IBUPROFEN 800 MG PO TABS: 800.0000 mg | ORAL_TABLET | Freq: Three times a day (TID) | ORAL | 0 refills | Status: AC

## 2023-12-04 MED ORDER — KETOROLAC TROMETHAMINE 60 MG/2ML IM SOLN
60.0000 mg | Freq: Once | INTRAMUSCULAR | Status: AC
  Administered 2023-12-04: 60 mg via INTRAMUSCULAR

## 2023-12-04 MED ORDER — KETOROLAC TROMETHAMINE 60 MG/2ML IM SOLN
INTRAMUSCULAR | Status: AC
  Filled 2023-12-04: qty 2
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
## 2023-12-04 NOTE — Discharge Instructions (Addendum)
 You are placed into a radial gutter splint today for second proximal phalanx fracture involving the MCP joint. Take ibuprofen 800 mg up to 3 times daily for pain relief.  He can also take Tylenol as well for breakthrough pain.  Medications can be safely combined if needed for severe pain I sent an urgent referral to orthopedic hand surgeon for evaluation Need to get repeat x-rays in 1 week.  If you have appointment with orthopedic hand surgeon they are able to do this at this time for further evaluation.  If not.  No return to urgent care for further evaluation.

## 2023-12-05 ENCOUNTER — Ambulatory Visit (HOSPITAL_COMMUNITY): Payer: Self-pay
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
## 2023-12-13 ENCOUNTER — Ambulatory Visit (INDEPENDENT_AMBULATORY_CARE_PROVIDER_SITE_OTHER): Payer: Self-pay | Admitting: Orthopedic Surgery

## 2023-12-13 ENCOUNTER — Other Ambulatory Visit (INDEPENDENT_AMBULATORY_CARE_PROVIDER_SITE_OTHER): Payer: Self-pay

## 2023-12-13 DIAGNOSIS — M79641 Pain in right hand: Secondary | ICD-10-CM

## 2023-12-13 DIAGNOSIS — S62610A Displaced fracture of proximal phalanx of right index finger, initial encounter for closed fracture: Secondary | ICD-10-CM

## 2024-01-02 NOTE — Progress Notes (Unsigned)
 Arthur Morris - 33 y.o. male MRN [id number]  Date of birth: 06-04-1990  Office Visit Note: Visit Date: 01/03/2024 PCP: Patient, No Pcp Per Referred by: No ref. provider found  Subjective: No chief complaint on file.  HPI: Arthur Morris is a pleasant 33 y.o. male who returns today for follow-up of right hand index finger injury sustained approximate 1 month prior.  We have been treating a nondisplaced fracture of the right hand index finger proximal phalanx.  Has been compliant with splinting as instructed.  Pain is currently controlled, swelling is improved, denies any significant numbness or tingling.  Pertinent ROS were reviewed with the patient and found to be negative unless otherwise specified above in HPI.      Assessment & Plan: Visit Diagnoses:  1. Closed displaced fracture of proximal phalanx of right index finger with routine healing, subsequent encounter   2. Pain in right hand     Plan: Extensive discussion was had with patient today regarding his right index finger injury.  Repeat x-rays were obtained today of the index finger which do show appropriate healing of the fracture of the proximal phalanx region, there is some extension into the MCP region however there remains no significant joint step-off.  Examination does not show any rotational abnormalities.  We discussed that this is healing well with conservative measures.  At this juncture we discussed transitioning to buddy taping in order to allow for gentle range of motion of the digits to prevent ongoing further stiffness.  Patient would like to proceed with buddy taping of the index to the long finger which I do feel is appropriate given the stability of his examination and lack of interval displacement with x-rays today.  I did emphasize the importance of ongoing buddy taping and the importance to refrain from weightbearing activities of the right hand currently.  Patient expressed understanding.  He  will return to me in approximately 3 weeks for repeat clinical and radiographic check.  We discussed the appropriate timeline of bony healing and the potential need for post recovery therapy in order to regain range of motion and strength.  Follow-up: No follow-ups on file.   Meds & Orders: No orders of the defined types were placed in this encounter.   Orders Placed This Encounter  Procedures   XR Finger Index Right     Procedures: No procedures performed      Clinical History: No specialty comments available.  He reports that he has never smoked. He has never used smokeless tobacco. No results for input(s): HGBA1C, LABURIC in the last 8760 hours.  Objective:   Vital Signs: There were no vitals taken for this visit.  Physical Exam  Gen: Well-appearing, in no acute distress; non-toxic CV: Regular Rate. Well-perfused. Warm.  Resp: Breathing unlabored on room air; no wheezing. Psych: Fluid speech in conversation; appropriate affect; normal thought process  Ortho Exam Right hand: - Skin is intact, minimal tenderness elicited over the index finger proximal phalanx region, able to perform gentle range of motion, no significant rotational abnormality, sensation intact distally, digit remains warm well-perfused   Imaging: XR Finger Index Right Result Date: 01/03/2024 X-rays of the right index finger demonstrate the nondisplaced longitudinal and obliquely oriented fracture of the second proximal phalanx, with extension to the metacarpophalangeal joint.  Appropriate healing and bony consolidation in comparison to previous films.    Past Medical/Family/Surgical/Social History: Medications & Allergies reviewed per EMR, new medications updated. There are no active problems to display for this  patient.  No past medical history on file. Family History  Problem Relation Age of Onset   Diabetes Father    No past surgical history on file. Social History   Occupational History    Not on file  Tobacco Use   Smoking status: Never   Smokeless tobacco: Never  Vaping Use   Vaping status: Never Used  Substance and Sexual Activity   Alcohol use: Yes   Drug use: Never   Sexual activity: Yes    Arthur Morris) Arlinda, M.D. [facility], Hand Surgery

## 2024-01-03 ENCOUNTER — Other Ambulatory Visit: Payer: Self-pay

## 2024-01-03 ENCOUNTER — Ambulatory Visit: Payer: Self-pay | Admitting: Orthopedic Surgery

## 2024-01-03 DIAGNOSIS — S62610A Displaced fracture of proximal phalanx of right index finger, initial encounter for closed fracture: Secondary | ICD-10-CM

## 2024-01-03 DIAGNOSIS — S62610D Displaced fracture of proximal phalanx of right index finger, subsequent encounter for fracture with routine healing: Secondary | ICD-10-CM

## 2024-01-03 DIAGNOSIS — M79641 Pain in right hand: Secondary | ICD-10-CM

## 2024-01-18 NOTE — Progress Notes (Deleted)
 DOI: 12/04/23 Right hand index finger fracture

## 2024-01-22 ENCOUNTER — Ambulatory Visit: Payer: Self-pay | Admitting: Orthopedic Surgery
